# Patient Record
Sex: Female | Born: 1955 | Hispanic: No | Marital: Married
Health system: Southern US, Community
[De-identification: ages and names within clinical notes are randomized; demographics above are authoritative.]

---

## 2017-04-21 ENCOUNTER — Encounter (HOSPITAL_COMMUNITY): Payer: Self-pay | Admitting: Emergency Medicine

## 2017-04-21 ENCOUNTER — Ambulatory Visit (INDEPENDENT_AMBULATORY_CARE_PROVIDER_SITE_OTHER): Payer: Self-pay

## 2017-04-21 ENCOUNTER — Other Ambulatory Visit: Payer: Self-pay

## 2017-04-21 ENCOUNTER — Ambulatory Visit (HOSPITAL_COMMUNITY)
Admission: EM | Admit: 2017-04-21 | Discharge: 2017-04-21 | Disposition: A | Discharge status: Home / Self Care | Payer: Self-pay | Attending: Emergency Medicine | Admitting: Emergency Medicine

## 2017-04-21 DIAGNOSIS — S93401A Sprain of unspecified ligament of right ankle, initial encounter: Secondary | ICD-10-CM

## 2017-04-21 NOTE — ED Triage Notes (Signed)
Pt states two days ago she tripped and fell and twisted her R ankle. Denies hitting head or LOC.

## 2017-04-21 NOTE — ED Provider Notes (Signed)
[facility]    CSN: [id number] Arrival date & time: 04/21/17  1348     History   Chief Complaint Chief Complaint  Patient presents with  . Fall  . Foot Pain    HPI Colleen Short is a 62 y.o. female.  patient states that she twisted her ankle a few days ago and is concerned that she might have broken it is of the swelling. Patient states she's able to bear weight and perform most of her activities of daily living. Denies numbness or tingling. Denies significant medical history or allergies to medication. HPI  History reviewed. No pertinent past medical history.  There are no active problems to display for this patient.   History reviewed. No pertinent surgical history.  OB History    No data available       Home Medications    Prior to Admission medications   Not on File    Family History No family history on file.  Social History Social History   Tobacco Use  . Smoking status: Not on file  Substance Use Topics  . Alcohol use: Not on file  . Drug use: Not on file     Allergies   Patient has no known allergies.   Review of Systems Review of Systems  Constitutional: Negative.  Negative for fatigue and fever.  HENT: Negative.   Eyes: Negative.  Negative for visual disturbance.  Respiratory: Negative.  Negative for cough and shortness of breath.   Cardiovascular: Negative.  Negative for chest pain and leg swelling.  Gastrointestinal: Negative.   Endocrine: Negative.   Genitourinary: Negative.   Musculoskeletal: Positive for joint swelling. Negative for gait problem and neck stiffness.       Right lateral ankle swelling.  Skin: Negative.  Negative for color change and wound.  Allergic/Immunologic: Negative.   Neurological: Negative.  Negative for dizziness and headaches.  Hematological: Negative.   Psychiatric/Behavioral: Negative.      Physical Exam Triage Vital Signs ED Triage Vitals [04/21/17 1500]  Enc Vitals Group     BP  132/67     Pulse Rate 88     Resp 18     Temp 98.4 F (36.9 C)     Temp src      SpO2 100 %     Weight      Height      Head Circumference      Peak Flow      Pain Score      Pain Loc      Pain Edu?      Excl. in GC?    No data found.  Updated Vital Signs BP 132/67   Pulse 88   Temp 98.4 F (36.9 C)   Resp 18   SpO2 100%   Visual Acuity Right Eye Distance:   Left Eye Distance:   Bilateral Distance:    Right Eye Near:   Left Eye Near:    Bilateral Near:     Physical Exam  Constitutional: She appears well-developed and well-nourished. No distress.  Eyes: Conjunctivae are normal. Pupils are equal, round, and reactive to light. Right eye exhibits no discharge. Left eye exhibits no discharge. No scleral icterus.  Neck: Normal range of motion. Neck supple. No thyromegaly present.  Cardiovascular: Normal rate, regular rhythm, normal heart sounds and intact distal pulses. Exam reveals no gallop and no friction rub.  No murmur heard. Pulses:      Dorsalis pedis pulses are 2+ on the  right side, and 2+ on the left side.       Posterior tibial pulses are 2+ on the right side, and 2+ on the left side.  Pulmonary/Chest: Effort normal and breath sounds normal. No respiratory distress. She has no wheezes. She has no rales. She exhibits no tenderness.  Musculoskeletal: Normal range of motion. She exhibits edema and tenderness. She exhibits no deformity.       Right foot: There is normal range of motion and no deformity.  Feet:  Right Foot:  Skin Integrity: Negative for ulcer, blister, skin breakdown, erythema or warmth.  Skin: She is not diaphoretic.  Nursing note and vitals reviewed.    UC Treatments / Results  Labs (all labs ordered are listed, but only abnormal results are displayed) Labs Reviewed - No data to display  EKG  EKG Interpretation None       Radiology Dg Ankle Complete Right  Result Date: 04/21/2017 CLINICAL DATA:  Per pt's daughter: fell and rolled  the right foot inwards two days ago. Pain and swelling is the right lateral malleolus. Prior right foot fracture of the 4th AND 5th metatarsals. Patient is not a diabetic. No prior injury to the right ankle. EXAM: RIGHT ANKLE - COMPLETE 3+ VIEW COMPARISON:  None. FINDINGS: There is no evidence of fracture, dislocation, or joint effusion. Soft tissues are unremarkable. Small plantar and Achilles calcaneal spurs. IMPRESSION: No evidence for acute  abnormality. Electronically Signed   By: Norva Pavlov M.D.   On: 04/21/2017 15:18    Procedures Procedures (including critical care time)  Medications Ordered in UC Medications - No data to display   Initial Impression / Assessment and Plan / UC Course  I have reviewed the triage vital signs and the nursing notes.  Pertinent labs & imaging results that were available during my care of the patient were reviewed by me and considered in my medical decision making (see chart for details).     Patient declines any medication or splint.  Requests we wrap with her ace wrap to demonstrate proper method.   Final Clinical Impressions(s) / UC Diagnoses   Final diagnoses:  Sprain of right ankle, unspecified ligament, initial encounter    ED Discharge Orders    None       Controlled Substance Prescriptions [place] Controlled Substance Registry consulted? Not Applicable   Patient states that the pain is tolerable and she is able to manage symptoms with rest ice compression and elevation.  The usual and customary discharge instructions and warnings were given.  The patient verbalizes understanding and agrees to plan of care.        Servando Salina, NP 04/21/17 [phone]

## 2019-09-05 IMAGING — DX DG ANKLE COMPLETE 3+V*R*
3 series · 3 of 3 positions shown · non-contrast
Comparison: None.

CLINICAL DATA: Per pt's daughter: fell and rolled the right foot
inwards two days ago. Pain and swelling is the right lateral
malleolus. Prior right foot fracture of the 4th AND 5th metatarsals.
Patient is not a diabetic. No prior injury to the right ankle.

EXAM:
RIGHT ANKLE - COMPLETE 3+ VIEW

[ankle ap]
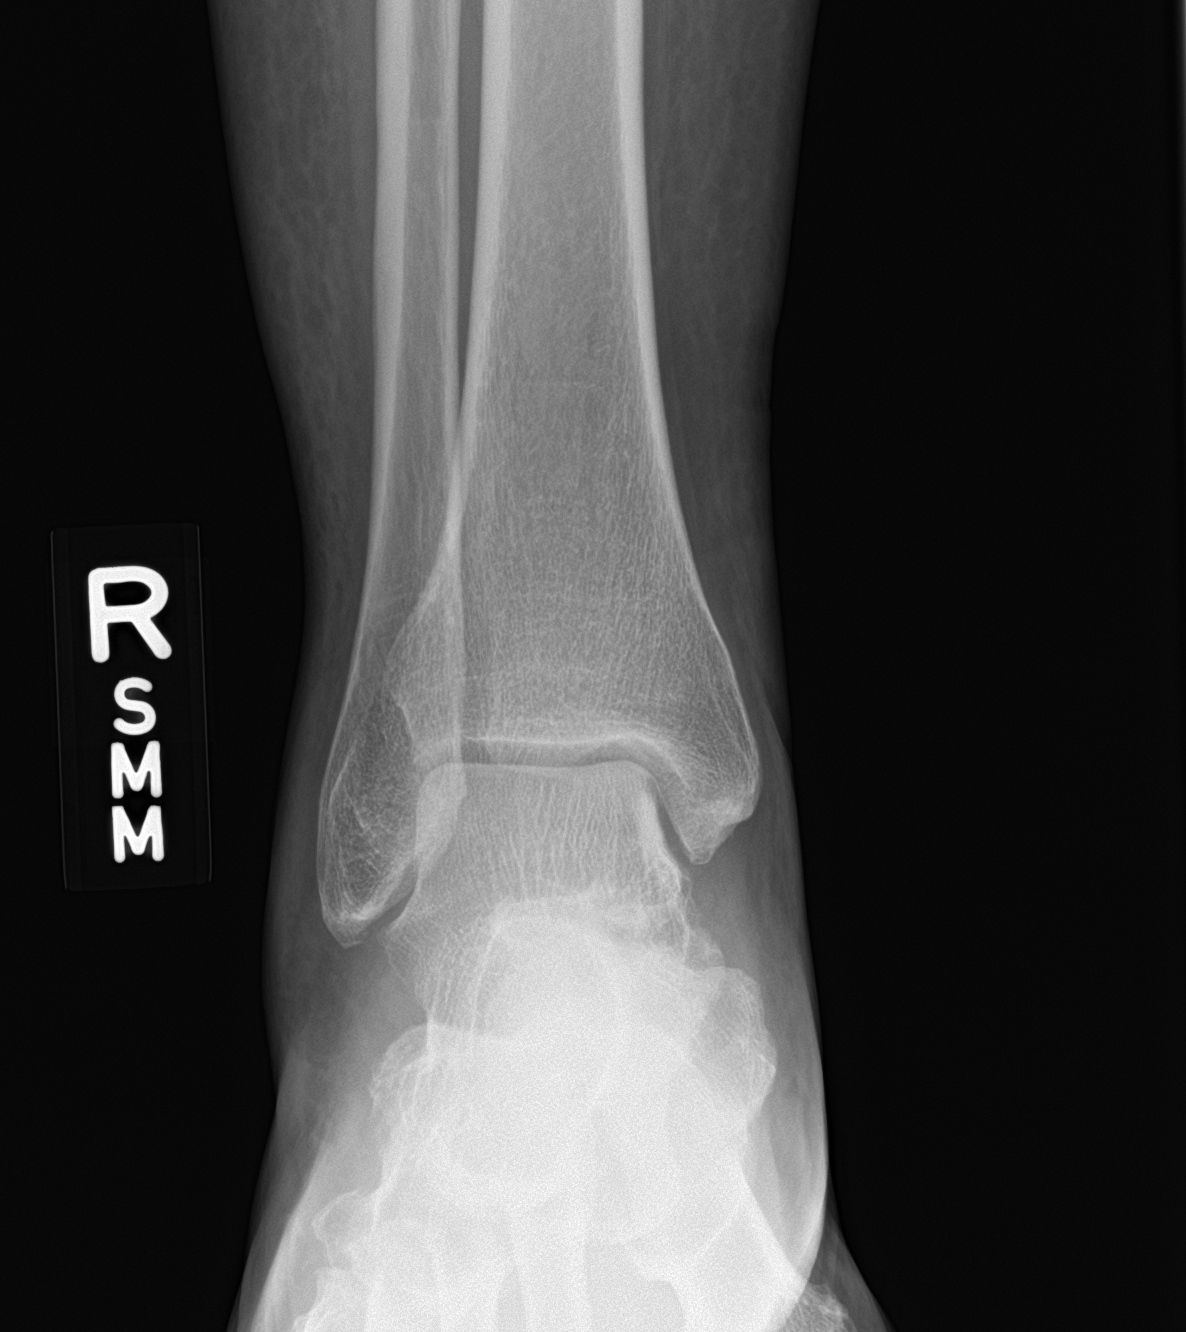

[ankle obl]
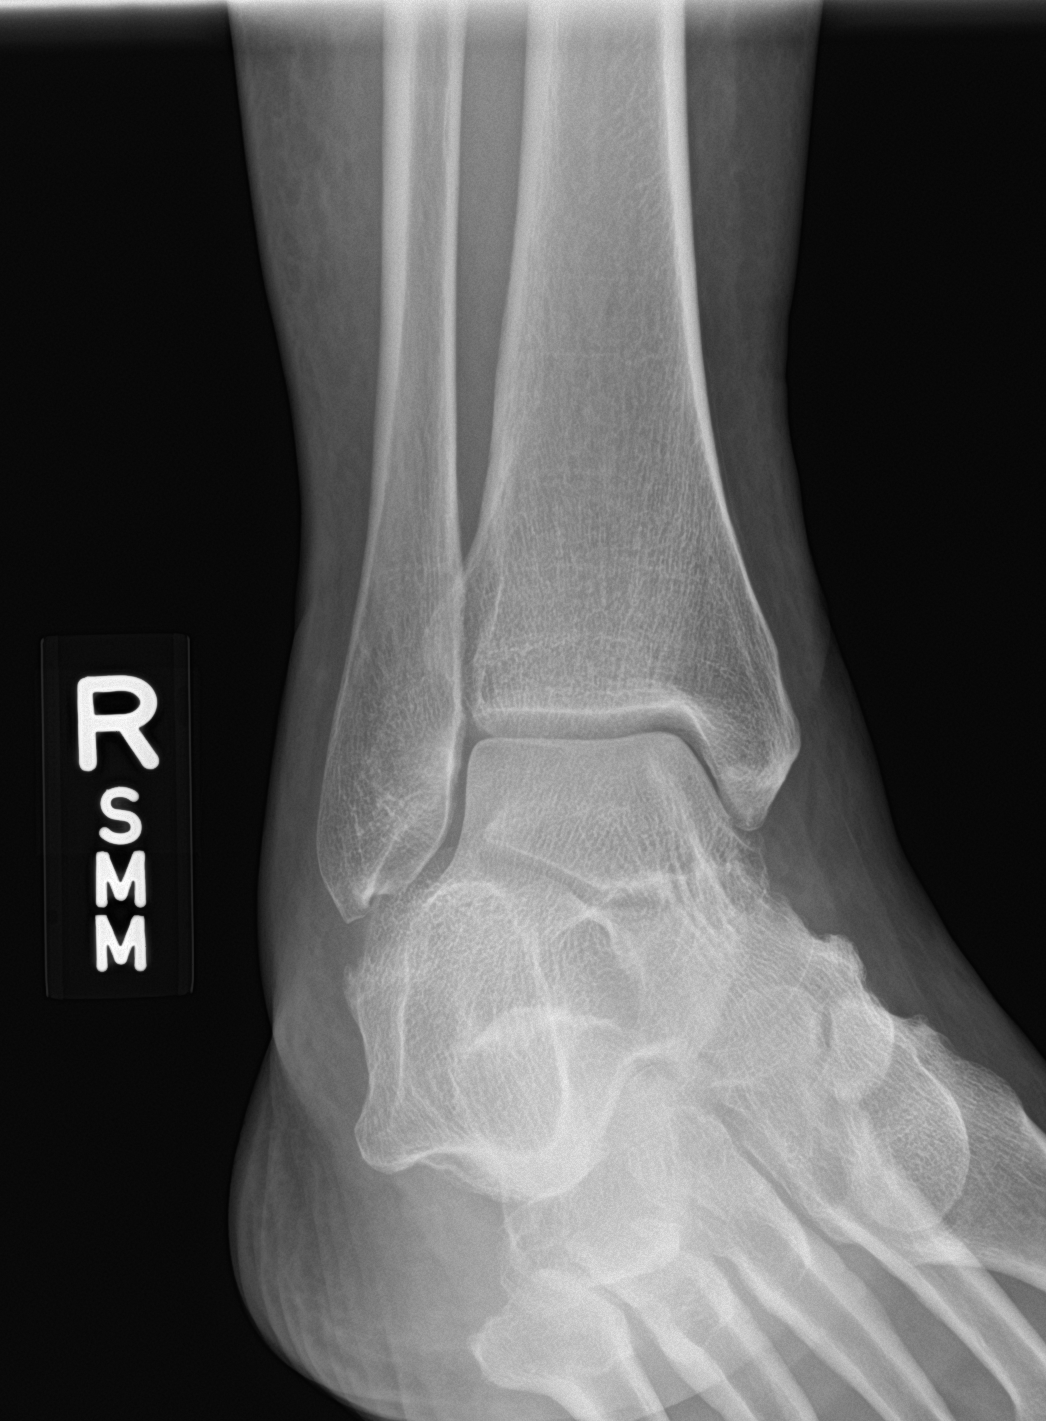

[ankle lat]
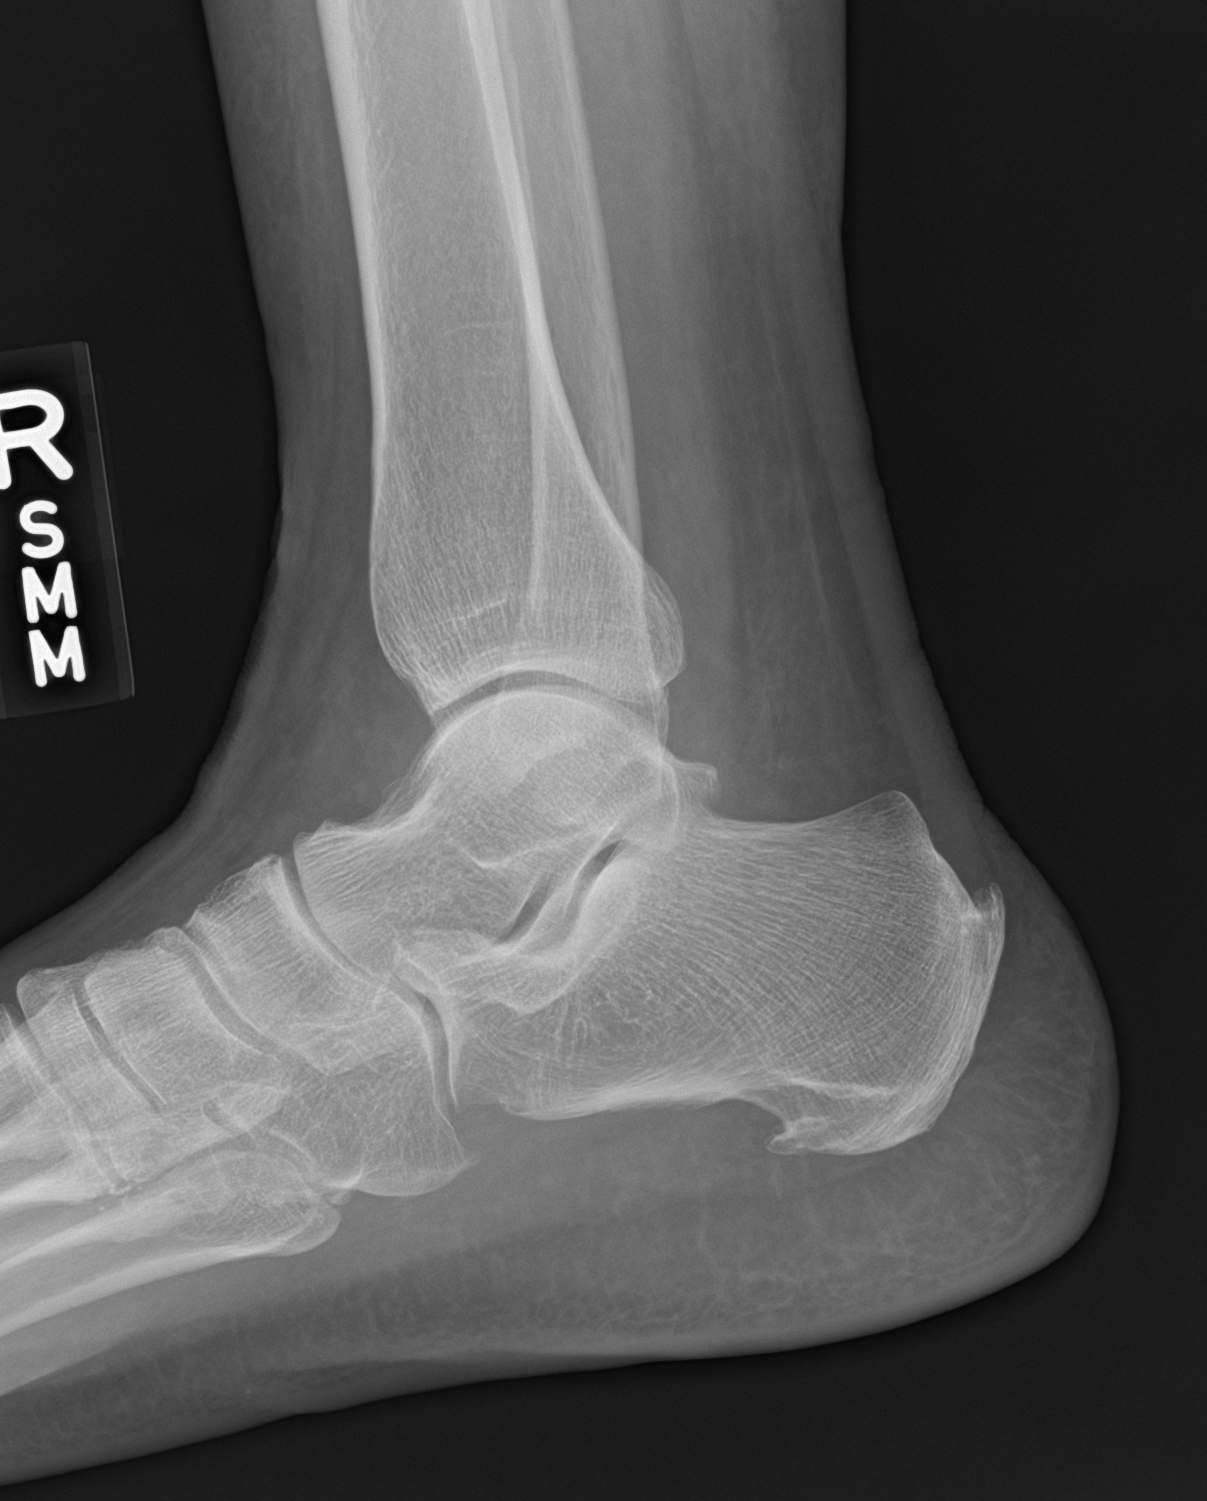

[3 of 3 positions shown; findings below may reference images not displayed]

FINDINGS: There is no evidence of fracture, dislocation, or joint effusion.
Soft tissues are unremarkable. Small plantar and Achilles calcaneal
spurs.
IMPRESSION: No evidence for acute  abnormality.
